# Patient Record
Sex: Male | Born: 1995 | Hispanic: Yes | Marital: Single | Smoking: Current some day smoker
Health system: Southern US, Community
[De-identification: ages and names within clinical notes are randomized; demographics above are authoritative.]

## PROBLEM LIST (undated history)

## (undated) DIAGNOSIS — J45909 Unspecified asthma, uncomplicated: Secondary | ICD-10-CM

---
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
## 2013-03-05 ENCOUNTER — Emergency Department (HOSPITAL_COMMUNITY)
Admission: EM | Admit: 2013-03-05 | Discharge: 2013-03-06 | Disposition: A | Discharge status: Home / Self Care | Payer: Medicaid Other | Attending: Emergency Medicine | Admitting: Emergency Medicine

## 2013-03-05 ENCOUNTER — Encounter (HOSPITAL_COMMUNITY): Payer: Self-pay | Admitting: *Deleted

## 2013-03-05 DIAGNOSIS — S0502XA Injury of conjunctiva and corneal abrasion without foreign body, left eye, initial encounter: Secondary | ICD-10-CM

## 2013-03-05 DIAGNOSIS — Y929 Unspecified place or not applicable: Secondary | ICD-10-CM | POA: Insufficient documentation

## 2013-03-05 DIAGNOSIS — S058X9A Other injuries of unspecified eye and orbit, initial encounter: Secondary | ICD-10-CM | POA: Insufficient documentation

## 2013-03-05 DIAGNOSIS — J45909 Unspecified asthma, uncomplicated: Secondary | ICD-10-CM | POA: Insufficient documentation

## 2013-03-05 DIAGNOSIS — X58XXXA Exposure to other specified factors, initial encounter: Secondary | ICD-10-CM | POA: Insufficient documentation

## 2013-03-05 DIAGNOSIS — Z79899 Other long term (current) drug therapy: Secondary | ICD-10-CM | POA: Insufficient documentation

## 2013-03-05 DIAGNOSIS — Y939 Activity, unspecified: Secondary | ICD-10-CM | POA: Insufficient documentation

## 2013-03-05 HISTORY — DX: Unspecified asthma, uncomplicated: J45.909

## 2013-03-05 MED ORDER — FLUORESCEIN SODIUM 1 MG OP STRP
1.0000 | ORAL_STRIP | Freq: Once | OPHTHALMIC | Status: AC
  Administered 2013-03-06: 1 via OPHTHALMIC
  Filled 2013-03-05: qty 1

## 2013-03-05 MED ORDER — TETRACAINE HCL 0.5 % OP SOLN
1.0000 [drp] | Freq: Once | OPHTHALMIC | Status: AC
  Administered 2013-03-06: 2 [drp] via OPHTHALMIC
  Filled 2013-03-05: qty 2

## 2013-03-05 NOTE — ED Notes (Signed)
Pt states he woke up this morning with left eye pain. Pt states that his left eye is draining. Pt used OTC drops which made his eye burn.

## 2013-03-05 NOTE — ED Provider Notes (Signed)
CSN: [id number]     Arrival date & time 03/05/13  2054 History     First MD Initiated Contact with Patient 03/05/13 2303     Chief Complaint  Patient presents with  . Eye Pain   (Consider location/radiation/quality/duration/timing/severity/associated sxs/prior Treatment) HPI Comments: Patient has a 17 year old man presenting to the emergency department for left eye pain that began this morning when the patient woke up. She states it feels as though something is at his side and scratching it. He has also had associated watery discharge with this feeling. Patient rates his pain 8/10 at worst. No alleviating factors. Patient is not a contact lens wearer. Patient has not Agricultural engineer. He denies any fevers or chills or visual disturbances.   Past Medical History  Diagnosis Date  . Asthma    History reviewed. No pertinent past surgical history. History reviewed. No pertinent family history. History  Substance Use Topics  . Smoking status: Never Smoker   . Smokeless tobacco: Not on file  . Alcohol Use: No    Review of Systems  Constitutional: Negative for fever and chills.  HENT: Negative.   Eyes: Positive for pain, discharge and redness. Negative for photophobia and visual disturbance.  Neurological: Negative for headaches.    Allergies  Review of patient's allergies indicates no known allergies.  Home Medications   Current Outpatient Rx  Name  Route  Sig  Dispense  Refill  . Multiple Vitamin (MULTI-VITAMIN PO)   Oral   Take 1 tablet by mouth daily.         . Omega-3 Fatty Acids (FISH OIL PO)   Oral   Take 1 capsule by mouth daily.         Marland Kitchen erythromycin ophthalmic ointment   Ophthalmic   Apply to eye every 6 (six) hours. Place 1/2 inch ribbon of ointment in the affected eye 4 times a day for five days   1 g   1    BP 122/78  Pulse 69  Temp(Src) 97.9 F (36.6 C) (Oral)  Resp 16  SpO2 99% Physical Exam  Constitutional: He is oriented to person, place, and  time. He appears well-developed and well-nourished. No distress.  HENT:  Head: Normocephalic and atraumatic.  Right Ear: External ear normal.  Left Ear: External ear normal.  Nose: Nose normal.  Eyes: EOM are normal. Pupils are equal, round, and reactive to light. Right eye exhibits no chemosis, no discharge, no exudate and no hordeolum. No foreign body present in the right eye. Left eye exhibits discharge (watery). Left eye exhibits no chemosis, no exudate and no hordeolum. Left conjunctiva is injected (Minimally).  Fundoscopic exam:      The right eye shows no hemorrhage and no papilledema.       The left eye shows no hemorrhage and no papilledema.  Slit lamp exam:      The left eye shows corneal abrasion and fluorescein uptake. The left eye shows no corneal flare, no corneal ulcer, no foreign body and no hyphema.  OD: 20/13 OS: 20/13 Bilateral: 20/13  Neck: Neck supple.  Pulmonary/Chest: Effort normal.  Neurological: He is alert and oriented to person, place, and time.  Skin: Skin is warm and dry. He is not diaphoretic.  Psychiatric: He has a normal mood and affect.    ED Course   Procedures (including critical care time)  Labs Reviewed - No data to display No results found. 1. Corneal abrasion, left, initial encounter     MDM  Afebrile,  NAD, non-toxic appearing, AAOx4. Pt with corneal abrasion on PE. Eye irrigated w NS, no evidence of FB.  No change in vision, acuity equal bilaterally.  Pt is not a contact lens wearer.  Exam non-concerning for orbital cellulitis, hyphema, corneal ulcers. Patient will be discharged home with erythromycin.   Patient understands to follow up with ophthalmology, & to return to ER if new symptoms develop including change in vision, purulent drainage, or entrapment. Return precautions discussed. Patient is stable at time of discharge      Jeannetta Ellis, PA-C 03/06/13 1610

## 2013-03-06 MED ORDER — ERYTHROMYCIN 5 MG/GM OP OINT: TOPICAL_OINTMENT | Freq: Four times a day (QID) | OPHTHALMIC | Status: DC

## 2013-03-06 NOTE — Discharge Instructions (Signed)
Please followup with the eye doctor listed above. Please use antibiotic drops as prescribed. Please take Motrin or Tylenol for any discomfort you may have. Please read all discharge instructions and return precautions.     Corneal Abrasion The cornea is the clear covering at the front and center of the eye. When looking at the colored portion (iris) of the eye, you are looking through that person's cornea.  This very thin tissue is made up of many layers. The surface layer is a single layer of cells called the corneal epithelium. This is one of the most sensitive tissues in the body. If a scratch or injury causes the corneal epithelium to come off, it is called a corneal abrasion. If the injury extends to the tissues below the epithelium, the condition is called a corneal ulcer.  CAUSES   Scratches.  Trauma.  Foreign body in the eye.  Some people have recurrences of abrasions in the area of the original injury even after they heal. This is called recurrent erosion syndrome. Recurrent erosion syndromes generally improve and go away with time. SYMPTOMS   Eye pain.  Difficulty or inability to keep the injured eye open.  The eye becomes very sensitive to light.  Recurrent erosions tend to happen suddenly, first thing in the morning  usually upon awakening and opening the eyes. DIAGNOSIS  Your eye professional can diagnose a corneal abrasion during an eye exam. Dye is usually placed in the eye using a drop or a small paper strip moistened by the patient's tears. When the eye is examined with a special light, the abrasion shows up clearly because of the dye. TREATMENT   Small abrasions may be treated with antibiotic drops or ointment alone.  Usually a pressure patch is specially applied. Pressure patches prevent the eye from blinking, allowing the corneal epithelium to heal. Because blinking is less, a pressure patch also reduces the amount of pain present in the eye during healing. Most  corneal abrasions heal within 2-3 days with no effect on vision. WARNING: Do not drive or operate machinery while your eye is patched. Your ability to judge distances is impaired.  If abrasion becomes infected and spreads to the deeper tissues of the cornea, a corneal ulcer can result. This is serious because it can cause corneal scarring. Corneal scars interfere with light passing through the cornea, and cause a loss of vision in the involved eye.  If your caregiver has given you a follow-up appointment, it is very important to keep that appointment. Not keeping the appointment could result in a severe eye infection or permanent loss of vision. If there is any problem keeping the appointment, you must call back to this facility for assistance. SEEK MEDICAL CARE IF:   You have pain, light sensitivity and a scratchy feeling in one eye (or both).  Your pressure patch keeps loosening up and you can blink your eye under the patch after treatment.  Any kind of discharge develops from the involved eye after treatment or if the lids stick together in the morning.  You have the same symptoms in the morning as you did with the original abrasion days, weeks or months after the abrasion healed. MAKE SURE YOU:   Understand these instructions.  Will watch your condition.  Will get help right away if you are not doing well or get worse. Document Released: 06/26/2000 Document Revised: 09/21/2011 Document Reviewed: 02/02/2008 [facility] Patient Information 2014 [place], [place].

## 2013-03-06 NOTE — ED Provider Notes (Signed)
Medical screening examination/treatment/procedure(s) were performed by non-physician practitioner and as supervising physician I was immediately available for consultation/collaboration.  Doug Sou, MD 03/06/13 [phone]

## 2014-06-28 ENCOUNTER — Encounter: Payer: Self-pay | Admitting: Pediatrics

## 2014-07-15 ENCOUNTER — Encounter (HOSPITAL_COMMUNITY): Payer: Self-pay | Admitting: Emergency Medicine

## 2014-07-15 ENCOUNTER — Emergency Department (HOSPITAL_COMMUNITY)
Admission: EM | Admit: 2014-07-15 | Discharge: 2014-07-15 | Disposition: A | Discharge status: Home / Self Care | Payer: Medicaid Other | Attending: Emergency Medicine | Admitting: Emergency Medicine

## 2014-07-15 DIAGNOSIS — Z79899 Other long term (current) drug therapy: Secondary | ICD-10-CM | POA: Insufficient documentation

## 2014-07-15 DIAGNOSIS — R110 Nausea: Secondary | ICD-10-CM | POA: Insufficient documentation

## 2014-07-15 DIAGNOSIS — J45909 Unspecified asthma, uncomplicated: Secondary | ICD-10-CM | POA: Insufficient documentation

## 2014-07-15 DIAGNOSIS — M545 Low back pain, unspecified: Secondary | ICD-10-CM

## 2014-07-15 DIAGNOSIS — N200 Calculus of kidney: Secondary | ICD-10-CM | POA: Diagnosis not present

## 2014-07-15 DIAGNOSIS — Z720 Tobacco use: Secondary | ICD-10-CM | POA: Insufficient documentation

## 2014-07-15 LAB — URINALYSIS, ROUTINE W REFLEX MICROSCOPIC
BILIRUBIN URINE: NEGATIVE
GLUCOSE, UA: NEGATIVE mg/dL
Ketones, ur: NEGATIVE mg/dL
Leukocytes, UA: NEGATIVE
Nitrite: NEGATIVE
PROTEIN: NEGATIVE mg/dL
SPECIFIC GRAVITY, URINE: 1.022 (ref 1.005–1.030)
UROBILINOGEN UA: 0.2 mg/dL (ref 0.0–1.0)
pH: 6 (ref 5.0–8.0)

## 2014-07-15 LAB — URINE MICROSCOPIC-ADD ON

## 2014-07-15 MED ORDER — CYCLOBENZAPRINE HCL 10 MG PO TABS
10.0000 mg | ORAL_TABLET | Freq: Once | ORAL | Status: AC
  Administered 2014-07-15: 10 mg via ORAL
  Filled 2014-07-15: qty 1

## 2014-07-15 MED ORDER — HYDROCODONE-ACETAMINOPHEN 5-325 MG PO TABS: 1.0000 | ORAL_TABLET | Freq: Four times a day (QID) | ORAL | Status: DC | PRN

## 2014-07-15 MED ORDER — HYDROCODONE-ACETAMINOPHEN 5-325 MG PO TABS
2.0000 | ORAL_TABLET | Freq: Once | ORAL | Status: AC
  Administered 2014-07-15: 2 via ORAL
  Filled 2014-07-15: qty 2

## 2014-07-15 NOTE — Discharge Instructions (Signed)

## 2014-07-15 NOTE — ED Provider Notes (Signed)
CSN: [id number]     Arrival date & time 07/15/14  0935 History   First MD Initiated Contact with Patient 07/15/14 [phone]     Chief Complaint  Patient presents with  . Back Pain     (Consider location/radiation/quality/duration/timing/severity/associated sxs/prior Treatment) Patient is a 19 y.o. male presenting with back pain. The history is provided by the patient.  Back Pain Location:  Lumbar spine Quality:  Stabbing Radiates to: occasionally radiates forward. Pain severity:  Mild Onset quality:  Gradual Duration:  3 days Timing:  Intermittent Progression:  Unchanged Chronicity:  New Context: not physical stress and not recent injury   Relieved by:  Nothing Worsened by:  Coughing Associated symptoms: no abdominal pain, no chest pain and no fever     Past Medical History  Diagnosis Date  . Asthma    History reviewed. No pertinent past surgical history. History reviewed. No pertinent family history. History  Substance Use Topics  . Smoking status: Current Every Day Smoker  . Smokeless tobacco: Not on file  . Alcohol Use: Yes    Review of Systems  Constitutional: Negative for fever.  Cardiovascular: Negative for chest pain and leg swelling.  Gastrointestinal: Positive for nausea. Negative for vomiting and abdominal pain.  Musculoskeletal: Positive for back pain.  All other systems reviewed and are negative.     Allergies  Review of patient's allergies indicates no known allergies.  Home Medications   Prior to Admission medications   Medication Sig Start Date End Date Taking? Authorizing Provider  erythromycin ophthalmic ointment Apply to eye every 6 (six) hours. Place 1/2 inch ribbon of ointment in the affected eye 4 times a day for five days 03/06/13   Lise Auer Piepenbrink, PA-C  Multiple Vitamin (MULTI-VITAMIN PO) Take 1 tablet by mouth daily.    Historical Provider, MD  Omega-3 Fatty Acids (FISH OIL PO) Take 1 capsule by mouth daily.    Historical Provider,  MD   BP 139/80 mmHg  Pulse 78  Temp(Src) 97.5 F (36.4 C) (Oral)  Resp 20  SpO2 98% Physical Exam  Constitutional: He is oriented to person, place, and time. He appears well-developed and well-nourished. No distress.  HENT:  Head: Normocephalic and atraumatic.  Mouth/Throat: No oropharyngeal exudate.  Eyes: EOM are normal. Pupils are equal, round, and reactive to light.  Neck: Normal range of motion. Neck supple.  Cardiovascular: Normal rate and regular rhythm.  Exam reveals no friction rub.   No murmur heard. Pulmonary/Chest: Effort normal and breath sounds normal. No respiratory distress. He has no wheezes. He has no rales.  Abdominal: Soft. He exhibits no distension. There is no tenderness. There is no rebound.  Musculoskeletal: Normal range of motion. He exhibits no edema.       Thoracic back: He exhibits normal range of motion, no tenderness, no bony tenderness, no spasm and normal pulse.  Neurological: He is alert and oriented to person, place, and time.  Skin: Skin is warm. No rash noted. He is not diaphoretic.  Nursing note and vitals reviewed.   ED Course  Procedures (including critical care time) Labs Review Labs Reviewed  URINALYSIS, ROUTINE W REFLEX MICROSCOPIC    Imaging Review No results found.   EKG Interpretation None      MDM   Final diagnoses:  Right-sided low back pain without sciatica  Kidney stone    19 year old male with 3 days of right back pain. Worse with coughing. Pain is intermittent, no alleviating factors or instigating factors. No injury  that he notes. Was nauseated earlier this morning but did not vomit. No dysuria, hematuria, abdominal pain. Pain will occasionally radiate down and forward but not consistently. Here vitals stable. No CVA tenderness. No palpable tenderness or muscle spasm appreciated. Will check urine to look for blood. If musculoskeletal or related to kidney stones will treat with pain meds, so we'll give pain meds now.  He is young and healthy, no other medical problems, no hx of kidney stones. Patient's urine shows small blood, likely kidney stone. Given pain meds and instructed to f/u with PCP.  Elwin Mocha, MD 07/15/14 [phone]

## 2014-07-15 NOTE — ED Notes (Signed)
Pt c/o right sided back pain x 3 days; pt denies obvious inury

## 2014-08-05 ENCOUNTER — Emergency Department (HOSPITAL_COMMUNITY): Payer: Medicaid Other

## 2014-08-05 ENCOUNTER — Encounter (HOSPITAL_COMMUNITY): Payer: Self-pay | Admitting: *Deleted

## 2014-08-05 ENCOUNTER — Emergency Department (HOSPITAL_COMMUNITY)
Admission: EM | Admit: 2014-08-05 | Discharge: 2014-08-05 | Disposition: A | Discharge status: Home / Self Care | Payer: Medicaid Other | Attending: Emergency Medicine | Admitting: Emergency Medicine

## 2014-08-05 DIAGNOSIS — J45909 Unspecified asthma, uncomplicated: Secondary | ICD-10-CM | POA: Diagnosis not present

## 2014-08-05 DIAGNOSIS — Z79899 Other long term (current) drug therapy: Secondary | ICD-10-CM | POA: Diagnosis not present

## 2014-08-05 DIAGNOSIS — R319 Hematuria, unspecified: Secondary | ICD-10-CM | POA: Diagnosis not present

## 2014-08-05 DIAGNOSIS — R109 Unspecified abdominal pain: Secondary | ICD-10-CM

## 2014-08-05 DIAGNOSIS — Z720 Tobacco use: Secondary | ICD-10-CM | POA: Insufficient documentation

## 2014-08-05 LAB — URINALYSIS, ROUTINE W REFLEX MICROSCOPIC
BILIRUBIN URINE: NEGATIVE
GLUCOSE, UA: NEGATIVE mg/dL
Ketones, ur: NEGATIVE mg/dL
Leukocytes, UA: NEGATIVE
Nitrite: NEGATIVE
PH: 6.5 (ref 5.0–8.0)
Protein, ur: NEGATIVE mg/dL
SPECIFIC GRAVITY, URINE: 1.022 (ref 1.005–1.030)
UROBILINOGEN UA: 2 mg/dL — AB (ref 0.0–1.0)

## 2014-08-05 LAB — URINE MICROSCOPIC-ADD ON

## 2014-08-05 LAB — CBC WITH DIFFERENTIAL/PLATELET
BASOS PCT: 1 % (ref 0–1)
Basophils Absolute: 0 10*3/uL (ref 0.0–0.1)
Eosinophils Absolute: 0.4 10*3/uL (ref 0.0–0.7)
Eosinophils Relative: 8 % — ABNORMAL HIGH (ref 0–5)
HEMATOCRIT: 38.7 % — AB (ref 39.0–52.0)
HEMOGLOBIN: 12.5 g/dL — AB (ref 13.0–17.0)
LYMPHS ABS: 1.9 10*3/uL (ref 0.7–4.0)
Lymphocytes Relative: 37 % (ref 12–46)
MCH: 21.2 pg — ABNORMAL LOW (ref 26.0–34.0)
MCHC: 32.3 g/dL (ref 30.0–36.0)
MCV: 65.6 fL — AB (ref 78.0–100.0)
Monocytes Absolute: 0.8 10*3/uL (ref 0.1–1.0)
Monocytes Relative: 16 % — ABNORMAL HIGH (ref 3–12)
NEUTROS ABS: 2 10*3/uL (ref 1.7–7.7)
Neutrophils Relative %: 39 % — ABNORMAL LOW (ref 43–77)
Platelets: 272 10*3/uL (ref 150–400)
RBC: 5.9 MIL/uL — AB (ref 4.22–5.81)
RDW: 14.5 % (ref 11.5–15.5)
WBC: 5.1 10*3/uL (ref 4.0–10.5)

## 2014-08-05 LAB — BASIC METABOLIC PANEL
Anion gap: 11 (ref 5–15)
BUN: 8 mg/dL (ref 6–23)
CO2: 24 mmol/L (ref 19–32)
CREATININE: 0.78 mg/dL (ref 0.50–1.35)
Calcium: 8.9 mg/dL (ref 8.4–10.5)
Chloride: 102 mmol/L (ref 96–112)
GFR calc Af Amer: >90 mL/min (ref >90)
Glucose, Bld: 97 mg/dL (ref 70–99)
Potassium: 3.8 mmol/L (ref 3.5–5.1)
Sodium: 137 mmol/L (ref 135–145)

## 2014-08-05 MED ORDER — IBUPROFEN 400 MG PO TABS
600.0000 mg | ORAL_TABLET | Freq: Once | ORAL | Status: AC
  Administered 2014-08-05: 600 mg via ORAL
  Filled 2014-08-05 (×2): qty 1

## 2014-08-05 MED ORDER — TAMSULOSIN HCL 0.4 MG PO CAPS: 0.4000 mg | ORAL_CAPSULE | Freq: Every day | ORAL | Status: DC

## 2014-08-05 MED ORDER — HYDROCODONE-ACETAMINOPHEN 5-325 MG PO TABS: 1.0000 | ORAL_TABLET | Freq: Four times a day (QID) | ORAL | Status: DC | PRN

## 2014-08-05 MED ORDER — ONDANSETRON 4 MG PO TBDP: ORAL_TABLET | ORAL | Status: DC

## 2014-08-05 NOTE — ED Provider Notes (Signed)
CSN: [id number]     Arrival date & time 08/05/14  1542 History   First MD Initiated Contact with Patient 08/05/14 1829     Chief Complaint  Patient presents with  . Flank Pain     (Consider location/radiation/quality/duration/timing/severity/associated sxs/prior Treatment) Patient is a 19 y.o. male presenting with flank pain.  Flank Pain This is a recurrent problem. The current episode started yesterday. The problem occurs constantly. The problem has been resolved. Pertinent negatives include no chest pain, no abdominal pain, no headaches and no shortness of breath. Nothing aggravates the symptoms. Nothing relieves the symptoms.    Past Medical History  Diagnosis Date  . Asthma    History reviewed. No pertinent past surgical history. History reviewed. No pertinent family history. History  Substance Use Topics  . Smoking status: Current Some Day Smoker  . Smokeless tobacco: Not on file  . Alcohol Use: No    Review of Systems  Respiratory: Negative for shortness of breath.   Cardiovascular: Negative for chest pain.  Gastrointestinal: Negative for abdominal pain.  Genitourinary: Positive for flank pain.  Neurological: Negative for headaches.  All other systems reviewed and are negative.     Allergies  Review of patient's allergies indicates no known allergies.  Home Medications   Prior to Admission medications   Medication Sig Start Date End Date Taking? Authorizing Provider  erythromycin ophthalmic ointment Apply to eye every 6 (six) hours. Place 1/2 inch ribbon of ointment in the affected eye 4 times a day for five days Patient not taking: Reported on 07/15/2014 03/06/13   Lise AuerJennifer L Piepenbrink, PA-C  HYDROcodone-acetaminophen (NORCO/VICODIN) 5-325 MG per tablet Take 1 tablet by mouth every 6 (six) hours as needed for moderate pain. 08/05/14   Mirian MoMatthew Ricca Melgarejo, MD  Multiple Vitamin (MULTI-VITAMIN PO) Take 1 tablet by mouth daily.    Historical Provider, MD  Omega-3 Fatty  Acids (FISH OIL PO) Take 1 capsule by mouth daily.    Historical Provider, MD  ondansetron (ZOFRAN ODT) 4 MG disintegrating tablet 4mg  ODT q4 hours prn nausea/vomit 08/05/14   Mirian MoMatthew Lizzette Carbonell, MD  tamsulosin (FLOMAX) 0.4 MG CAPS capsule Take 1 capsule (0.4 mg total) by mouth daily. 08/05/14   Mirian MoMatthew Wiktoria Hemrick, MD   BP 132/55 mmHg  Pulse 70  Temp(Src) 97.9 F (36.6 C) (Oral)  Resp 17  Ht 5\' 6"  (1.676 m)  Wt 216 lb (97.977 kg)  BMI 34.88 kg/m2  SpO2 100% Physical Exam  Constitutional: He is oriented to person, place, and time. He appears well-developed and well-nourished.  HENT:  Head: Normocephalic and atraumatic.  Eyes: Conjunctivae and EOM are normal.  Neck: Normal range of motion. Neck supple.  Cardiovascular: Normal rate, regular rhythm and normal heart sounds.   Pulmonary/Chest: Effort normal and breath sounds normal. No respiratory distress.  Abdominal: He exhibits no distension. There is no tenderness. There is no rebound and no guarding.  Musculoskeletal: Normal range of motion.  Neurological: He is alert and oriented to person, place, and time.  Skin: Skin is warm and dry.  Vitals reviewed.   ED Course  Procedures (including critical care time) Labs Review Labs Reviewed  URINALYSIS, ROUTINE W REFLEX MICROSCOPIC - Abnormal; Notable for the following:    Hgb urine dipstick MODERATE (*)    Urobilinogen, UA 2.0 (*)    All other components within normal limits  CBC WITH DIFFERENTIAL/PLATELET - Abnormal; Notable for the following:    RBC 5.90 (*)    Hemoglobin 12.5 (*)    HCT  38.7 (*)    MCV 65.6 (*)    MCH 21.2 (*)    Neutrophils Relative % 39 (*)    Monocytes Relative 16 (*)    Eosinophils Relative 8 (*)    All other components within normal limits  URINE MICROSCOPIC-ADD ON  BASIC METABOLIC PANEL    Imaging Review Ct Renal Stone Study  08/05/2014   CLINICAL DATA:  Chronic left flank pain for 1 month. Mild dysuria and hematuria. Initial encounter.  EXAM: CT ABDOMEN  AND PELVIS WITHOUT CONTRAST  TECHNIQUE: Multidetector CT imaging of the abdomen and pelvis was performed following the standard protocol without IV contrast.  COMPARISON:  None.  FINDINGS: The visualized lung bases are clear.  The liver and spleen are unremarkable in appearance. The gallbladder is within normal limits. The pancreas and adrenal glands are unremarkable.  There is incomplete rotation of both kidneys. The kidneys are otherwise unremarkable. There is no evidence of hydronephrosis. No renal or ureteral stones are seen. No perinephric stranding is appreciated.  No free fluid is identified. The small bowel is unremarkable in appearance. The stomach is within normal limits. No acute vascular abnormalities are seen.  The appendix is normal in caliber and contains air, without evidence for appendicitis. The colon is unremarkable in appearance.  The bladder is mildly distended and grossly unremarkable. The prostate remains normal in size. No inguinal lymphadenopathy is seen.  No acute osseous abnormalities are identified.  IMPRESSION: No acute abnormality seen within the abdomen or pelvis. No evidence of hydronephrosis. No renal or ureteral stone seen.   Electronically Signed   By: Roanna Raider M.D.   On: 08/05/2014 21:52     EKG Interpretation None     Emergency Focused Ultrasound Exam Limited retroperitoneal ultrasound of kidneys  Performed and interpreted by Dr. Littie Deeds Indication: L flank pain Focused abdominal ultrasound with both kidneys imaged in transverse and longitudinal planes in real-time. Interpretation: No hydronephrosis visualized bilaterally.  Bladder full.   Images archived electronically  MDM   Final diagnoses:  Abdominal pain, acute  Hematuria    19 y.o. male with pertinent PMH of recent visit for R flank pain with presumed R nephrolithiasis after hematuria present presents with recurrent flank pain, this side L sided.  He has had nausea and chills but no fever. He has  no other medical history. His history is consistent with nephrolithiasis at this time, however as he has had no renal function measurement and has not had medical care in some time, we'll check basic labs. Ultrasound obtained by myself as above without hydronephrosis.  Checked basic labwork which demonstrated anemia.  As pt did not have any prior imaging, obtained CT scan which did not demonstrate stone.   Discussed results with pt and encouraged close fu with urology and PC, which he agreed to.  DC home in stable condition.  I have reviewed all laboratory and imaging studies if ordered as above  1. Hematuria   2. Abdominal pain, acute         Mirian Mo, MD 08/06/14 [phone]

## 2014-08-05 NOTE — Discharge Instructions (Signed)
Dietary Guidelines to Help Prevent Kidney Stones °Your risk of kidney stones can be decreased by adjusting the foods you eat. The most important thing you can do is drink enough fluid. You should drink enough fluid to keep your urine clear or pale yellow. The following guidelines provide specific information for the type of kidney stone you have had. °GUIDELINES ACCORDING TO TYPE OF KIDNEY STONE °Calcium Oxalate Kidney Stones °· Reduce the amount of salt you eat. Foods that have a lot of salt cause your body to release excess calcium into your urine. The excess calcium can combine with a substance called oxalate to form kidney stones. °· Reduce the amount of animal protein you eat if the amount you eat is excessive. Animal protein causes your body to release excess calcium into your urine. Ask your dietitian how much protein from animal sources you should be eating. °· Avoid foods that are high in oxalates. If you take vitamins, they should have less than 500 mg of vitamin C. Your body turns vitamin C into oxalates. You do not need to avoid fruits and vegetables high in vitamin C. °Calcium Phosphate Kidney Stones °· Reduce the amount of salt you eat to help prevent the release of excess calcium into your urine. °· Reduce the amount of animal protein you eat if the amount you eat is excessive. Animal protein causes your body to release excess calcium into your urine. Ask your dietitian how much protein from animal sources you should be eating. °· Get enough calcium from food or take a calcium supplement (ask your dietitian for recommendations). Food sources of calcium that do not increase your risk of kidney stones include: °· Broccoli. °· Dairy products, such as cheese and yogurt. °· Pudding. °Uric Acid Kidney Stones °· Do not have more than 6 oz of animal protein per day. °FOOD SOURCES °Animal Protein Sources °· Meat (all types). °· Poultry. °· Eggs. °· Fish, seafood. °Foods High in Salt °· Salt seasonings. °· Soy  sauce. °· Teriyaki sauce. °· Cured and processed meats. °· Salted crackers and snack foods. °· Fast food. °· Canned soups and most canned foods. °Foods High in Oxalates °· Grains: °· Amaranth. °· Barley. °· Grits. °· Wheat germ. °· Bran. °· Buckwheat flour. °· All bran cereals. °· Pretzels. °· Whole wheat bread. °· Vegetables: °· Beans (wax). °· Beets and beet greens. °· Collard greens. °· Eggplant. °· Escarole. °· Leeks. °· Okra. °· Parsley. °· Rutabagas. °· Spinach. °· Swiss chard. °· Tomato paste. °· Fried potatoes. °· Sweet potatoes. °· Fruits: °· Red currants. °· Figs. °· Kiwi. °· Rhubarb. °· Meat and Other Protein Sources: °· Beans (dried). °· Soy burgers and other soybean products. °· Miso. °· Nuts (peanuts, almonds, pecans, cashews, hazelnuts). °· Nut butters. °· Sesame seeds and tahini (paste made of sesame seeds). °· Poppy seeds. °· Beverages: °· Chocolate drink mixes. °· Soy milk. °· Instant iced tea. °· Juices made from high-oxalate fruits or vegetables. °· Other: °· Carob. °· Chocolate. °· Fruitcake. °· Marmalades. °Document Released: 10/24/2010 Document Revised: 07/04/2013 Document Reviewed: 05/26/2013 °ExitCare® Patient Information ©2015 [employer]. This information is not intended to replace advice given to you by your health care provider. Make sure you discuss any questions you have with your health care provider. ° °Kidney Stones °Kidney stones (urolithiasis) are deposits that form inside your kidneys. The intense pain is caused by the stone moving through the urinary tract. When the stone moves, the ureter goes into spasm around the   10/24/2010 Document Revised: 07/04/2013 Document Reviewed: 05/26/2013  ExitCare Patient Information 2015 [employer]. This information is not intended to replace advice given to you by your health care provider. Make sure you discuss any questions you have with your health care provider.  Kidney Stones  Kidney stones (urolithiasis) are deposits that form inside your kidneys. The intense pain is caused by the stone moving through the urinary tract. When the stone moves, the ureter goes into spasm around the stone. The stone is usually passed in the urine.   CAUSES    A disorder that makes certain neck glands produce too much parathyroid hormone (primary hyperparathyroidism).   A buildup of uric acid crystals, similar to gout in your joints.   Narrowing (stricture) of the ureter.   A kidney obstruction present at birth (congenital obstruction).   Previous surgery on  the kidney or ureters.   Numerous kidney infections.  SYMPTOMS    Feeling sick to your stomach (nauseous).   Throwing up (vomiting).   Blood in the urine (hematuria).   Pain that usually spreads (radiates) to the groin.   Frequency or urgency of urination.  DIAGNOSIS    Taking a history and physical exam.   Blood or urine tests.   CT scan.   Occasionally, an examination of the inside of the urinary bladder (cystoscopy) is performed.  TREATMENT    Observation.   Increasing your fluid intake.   Extracorporeal shock wave lithotripsy--This is a noninvasive procedure that uses shock waves to break up kidney stones.   Surgery may be needed if you have severe pain or persistent obstruction. There are various surgical procedures. Most of the procedures are performed with the use of small instruments. Only small incisions are needed to accommodate these instruments, so recovery time is minimized.  The size, location, and chemical composition are all important variables that will determine the proper choice of action for you. Talk to your health care provider to better understand your situation so that you will minimize the risk of injury to yourself and your kidney.   HOME CARE INSTRUCTIONS    Drink enough water and fluids to keep your urine clear or pale yellow. This will help you to pass the stone or stone fragments.   Strain all urine through the provided strainer. Keep all particulate matter and stones for your health care provider to see. The stone causing the pain may be as small as a grain of salt. It is very important to use the strainer each and every time you pass your urine. The collection of your stone will allow your health care provider to analyze it and verify that a stone has actually passed. The stone analysis will often identify what you can do to reduce the incidence of recurrences.   Only take over-the-counter or prescription medicines for pain, discomfort, or fever as directed by your  health care provider.   Make a follow-up appointment with your health care provider as directed.   Get follow-up X-rays if required. The absence of pain does not always mean that the stone has passed. It may have only stopped moving. If the urine remains completely obstructed, it can cause loss of kidney function or even complete destruction of the kidney. It is your responsibility to make sure X-rays and follow-ups are completed. Ultrasounds of the kidney can show blockages and the status of the kidney. Ultrasounds are not associated with any radiation and can be performed easily in a matter of minutes.  SEEK   Released: 06/29/2005 Document Revised: 03/01/2013 Document Reviewed: 11/30/2012 [facility] CExitCare Patient Information 2015 [place], [place]. This information is not intended to replace advice given to you by your health care provider. Make sure you discuss any questions you have with your health care provider. Anemia, Nonspecific Anemia is a condition in which the concentration of red blood cells or hemoglobin in the blood is below normal. Hemoglobin is a substance in red blood cells that carries oxygen to the tissues of the body. Anemia results in not enough oxygen reaching these tissues.  CAUSES  Common causes of anemia include:   Excessive bleeding. Bleeding may be internal or external. This  includes excessive bleeding from periods (in women) or from the intestine.   Poor nutrition.   Chronic kidney, thyroid, and liver disease.  Bone marrow disorders that decrease red blood cell production.  Cancer and treatments for cancer.  HIV, AIDS, and their treatments.  Spleen problems that increase red blood cell destruction.  Blood disorders.  Excess destruction of red blood cells due to infection, medicines, and autoimmune disorders. SIGNS AND SYMPTOMS   Minor weakness.   Dizziness.   Headache.  Palpitations.   Shortness of breath, especially with exercise.   Paleness.  Cold sensitivity.  Indigestion.  Nausea.  Difficulty sleeping.  Difficulty concentrating. Symptoms may occur suddenly or they may develop slowly.  DIAGNOSIS  Additional blood tests are often needed. These help your health care provider determine the best treatment. Your health care provider will check your stool for blood and look for other causes of blood loss.  TREATMENT  Treatment varies depending on the cause of the anemia. Treatment can include:   Supplements of iron, vitamin B12, or folic acid.   Hormone medicines.   A blood transfusion. This may be needed if blood loss is severe.   Hospitalization. This may be needed if there is significant continual blood loss.   Dietary changes.  Spleen removal. HOME CARE INSTRUCTIONS Keep all follow-up appointments. It often takes many weeks to correct anemia, and having your health care provider check on your condition and your response to treatment is very important. SEEK IMMEDIATE MEDICAL CARE IF:   You develop extreme weakness, shortness of breath, or chest pain.   You become dizzy or have trouble concentrating.  You develop heavy vaginal bleeding.   You develop a rash.   You have bloody or black, tarry stools.   You faint.   You vomit up blood.   You vomit repeatedly.   You have abdominal pain.  You  have a fever or persistent symptoms for more than 2-3 days.   You have a fever and your symptoms suddenly get worse.   You are dehydrated.  MAKE SURE YOU:  Understand these instructions.  Will watch your condition.  Will get help right away if you are not doing well or get worse. Document Released: 08/06/2004 Document Revised: 03/01/2013 Document Reviewed: 12/23/2012 [facility]Exit[facility] Patient Information 2015 [place], [place]. This information is not intended to replace advice given to you by your health care provider. Make sure you discuss any questions you have with your health care provider.

## 2014-08-05 NOTE — ED Notes (Signed)
Patient transported to CT 

## 2014-08-05 NOTE — ED Notes (Signed)
Pt c/o left flank pain for a month. Pt states he was dx with kidney stones. Tried to make an appointment with PCP but will not be seen till March. Pt reports some mild dysuria. Reports hematuria yesterday.

## 2014-08-05 NOTE — ED Notes (Signed)
Pt a/o x 4 on d/c with steady gait. Pt refused wheelchair. 

## 2015-01-07 ENCOUNTER — Encounter (HOSPITAL_COMMUNITY): Payer: Self-pay | Admitting: Emergency Medicine

## 2015-01-07 DIAGNOSIS — Z720 Tobacco use: Secondary | ICD-10-CM | POA: Insufficient documentation

## 2015-01-07 DIAGNOSIS — R197 Diarrhea, unspecified: Secondary | ICD-10-CM | POA: Diagnosis present

## 2015-01-07 DIAGNOSIS — K2970 Gastritis, unspecified, without bleeding: Secondary | ICD-10-CM | POA: Diagnosis not present

## 2015-01-07 DIAGNOSIS — J45909 Unspecified asthma, uncomplicated: Secondary | ICD-10-CM | POA: Insufficient documentation

## 2015-01-07 LAB — URINE MICROSCOPIC-ADD ON

## 2015-01-07 LAB — URINALYSIS, ROUTINE W REFLEX MICROSCOPIC
BILIRUBIN URINE: NEGATIVE
GLUCOSE, UA: NEGATIVE mg/dL
Ketones, ur: 15 mg/dL — AB
Leukocytes, UA: NEGATIVE
Nitrite: NEGATIVE
PROTEIN: NEGATIVE mg/dL
SPECIFIC GRAVITY, URINE: 1.027 (ref 1.005–1.030)
UROBILINOGEN UA: 1 mg/dL (ref 0.0–1.0)
pH: 6 (ref 5.0–8.0)

## 2015-01-07 NOTE — ED Notes (Signed)
Pt. reports emesis and diarrhea onset 2 weeks ago , denies fever or chills.

## 2015-01-08 ENCOUNTER — Emergency Department (HOSPITAL_COMMUNITY)
Admission: EM | Admit: 2015-01-08 | Discharge: 2015-01-08 | Disposition: A | Discharge status: Home / Self Care | Payer: Medicaid Other | Attending: Emergency Medicine | Admitting: Emergency Medicine

## 2015-01-08 DIAGNOSIS — K2970 Gastritis, unspecified, without bleeding: Secondary | ICD-10-CM

## 2015-01-08 LAB — CBC WITH DIFFERENTIAL/PLATELET
BASOS ABS: 0.1 10*3/uL (ref 0.0–0.1)
Basophils Relative: 1 % (ref 0–1)
EOS ABS: 0.1 10*3/uL (ref 0.0–0.7)
Eosinophils Relative: 2 % (ref 0–5)
HEMATOCRIT: 39.8 % (ref 39.0–52.0)
Hemoglobin: 12.8 g/dL — ABNORMAL LOW (ref 13.0–17.0)
LYMPHS ABS: 2.4 10*3/uL (ref 0.7–4.0)
Lymphocytes Relative: 34 % (ref 12–46)
MCH: 20.9 pg — AB (ref 26.0–34.0)
MCHC: 32.2 g/dL (ref 30.0–36.0)
MCV: 65.1 fL — AB (ref 78.0–100.0)
MONOS PCT: 10 % (ref 3–12)
Monocytes Absolute: 0.7 10*3/uL (ref 0.1–1.0)
Neutro Abs: 3.9 10*3/uL (ref 1.7–7.7)
Neutrophils Relative %: 53 % (ref 43–77)
PLATELETS: 302 10*3/uL (ref 150–400)
RBC: 6.11 MIL/uL — ABNORMAL HIGH (ref 4.22–5.81)
RDW: 14.5 % (ref 11.5–15.5)
WBC: 7.2 10*3/uL (ref 4.0–10.5)

## 2015-01-08 LAB — COMPREHENSIVE METABOLIC PANEL
ALT: 51 U/L (ref 17–63)
AST: 89 U/L — ABNORMAL HIGH (ref 15–41)
Albumin: 4.4 g/dL (ref 3.5–5.0)
Alkaline Phosphatase: 64 U/L (ref 38–126)
Anion gap: 6 (ref 5–15)
BUN: 6 mg/dL (ref 6–20)
CALCIUM: 8.8 mg/dL — AB (ref 8.9–10.3)
CO2: 26 mmol/L (ref 22–32)
CREATININE: 0.8 mg/dL (ref 0.61–1.24)
Chloride: 104 mmol/L (ref 101–111)
GFR calc Af Amer: >60 mL/min (ref >60)
GFR calc non Af Amer: >60 mL/min (ref >60)
GLUCOSE: 105 mg/dL — AB (ref 65–99)
Potassium: 3.8 mmol/L (ref 3.5–5.1)
Sodium: 136 mmol/L (ref 135–145)
TOTAL PROTEIN: 7.2 g/dL (ref 6.5–8.1)
Total Bilirubin: 0.8 mg/dL (ref 0.3–1.2)

## 2015-01-08 LAB — LIPASE, BLOOD: Lipase: 18 U/L — ABNORMAL LOW (ref 22–51)

## 2015-01-08 MED ORDER — PANTOPRAZOLE SODIUM 40 MG PO TBEC
40.0000 mg | DELAYED_RELEASE_TABLET | Freq: Once | ORAL | Status: AC
  Administered 2015-01-08: 40 mg via ORAL
  Filled 2015-01-08: qty 1

## 2015-01-08 MED ORDER — LANSOPRAZOLE 30 MG PO CPDR: 30.0000 mg | DELAYED_RELEASE_CAPSULE | Freq: Every day | ORAL | Status: AC

## 2015-01-08 MED ORDER — ONDANSETRON 4 MG PO TBDP
4.0000 mg | ORAL_TABLET | Freq: Once | ORAL | Status: AC
  Administered 2015-01-08: 4 mg via ORAL
  Filled 2015-01-08: qty 1

## 2015-01-08 MED ORDER — ONDANSETRON 4 MG PO TBDP: ORAL_TABLET | ORAL | Status: AC

## 2015-01-08 MED ORDER — GI COCKTAIL ~~LOC~~
30.0000 mL | Freq: Once | ORAL | Status: AC
  Administered 2015-01-08: 30 mL via ORAL
  Filled 2015-01-08: qty 30

## 2015-01-08 NOTE — ED Provider Notes (Signed)
CSN: [id number]     Arrival date & time 01/07/15  2239 History   This chart was scribed for Paul Levy Lazaria Schaben, MD by Arlan OrganAshley Leger, ED Scribe. This patient was seen in room B19C/B19C and the patient's care was started 3:50 AM.   Chief Complaint  Patient presents with  . Emesis  . Diarrhea   HPI  HPI Comments: Paul Levy is a 19 y.o. male without any pertinent past medical history who presents to the Emergency Department complaining of intermittent, ongoing vomiting with associated nausea x 2 weeks. He is unable to keep any solid foods down but can tolerate small amounts of liquid. Pt also reports loose stools; 2 stools in last 24 hours along with epigastric abdominal tenderness described as pressure. Symptoms are made worse after eating spicy foods. No OTC medications or home remedies attempted prior to arrival. No recent fever or chills. No significant NSAID use. No previous follow up with Gastroenterologist. No previous history of abdominal surgeries. No known allergies to medications. Patient states he does not know whether he's had blood in his stool or melena. Denies blood in vomit.  Past Medical History  Diagnosis Date  . Asthma    History reviewed. No pertinent past surgical history. No family history on file. History  Substance Use Topics  . Smoking status: Current Some Day Smoker  . Smokeless tobacco: Not on file  . Alcohol Use: No    Review of Systems  Constitutional: Negative for fever and chills.  Respiratory: Negative for cough and shortness of breath.   Cardiovascular: Negative for chest pain.  Gastrointestinal: Positive for nausea, vomiting, abdominal pain and diarrhea.  Musculoskeletal: Negative for back pain, neck pain and neck stiffness.  Skin: Negative for rash.  Neurological: Negative for dizziness, weakness, light-headedness, numbness and headaches.  Psychiatric/Behavioral: Negative for confusion.  All other systems reviewed and are  negative.     Allergies  Review of patient's allergies indicates no known allergies.  Home Medications   Prior to Admission medications   Medication Sig Start Date End Date Taking? Authorizing Provider  Multiple Vitamin (MULTI-VITAMIN PO) Take 1 tablet by mouth daily.   Yes Historical Provider, MD  lansoprazole (PREVACID) 30 MG capsule Take 1 capsule (30 mg total) by mouth daily at 12 noon. 01/08/15   Paul Levy Chanin Frumkin, MD  ondansetron (ZOFRAN ODT) 4 MG disintegrating tablet 4mg  ODT q4 hours prn nausea/vomit 01/08/15   Paul Levy Danta Baumgardner, MD   Triage Vitals: BP 130/91 mmHg  Pulse 64  Temp(Src) 98.4 F (36.9 C) (Oral)  Resp 18  Ht 5\' 6"  (1.676 m)  Wt 210 lb (95.255 kg)  BMI 33.91 kg/m2  SpO2 99%   Physical Exam  Constitutional: He is oriented to person, place, and time. He appears well-developed and well-nourished. No distress.  HENT:  Head: Normocephalic and atraumatic.  Mouth/Throat: Oropharynx is clear and moist.  Eyes: EOM are normal. Pupils are equal, round, and reactive to light.  Neck: Normal range of motion. Neck supple.  Cardiovascular: Normal rate and regular rhythm.   Pulmonary/Chest: Effort normal and breath sounds normal. No respiratory distress. He has no wheezes. He has no rales.  Abdominal: Soft. Bowel sounds are normal. There is tenderness (epigastric tenderness to palpation.).  Musculoskeletal: Normal range of motion. He exhibits no edema or tenderness.  No CVA tenderness bilaterally.  Neurological: He is alert and oriented to person, place, and time.  Moves all extremities without deficit. Sensation is grossly intact.  Skin: Skin is warm and dry. No rash  noted. No erythema.  Psychiatric: He has a normal mood and affect. His behavior is normal.  Nursing note and vitals reviewed.   ED Course  Procedures (including critical care time)  DIAGNOSTIC STUDIES: Oxygen Saturation is 99% on RA, Normal by my interpretation.    COORDINATION OF CARE: 3:50 AM- Will  order CBC, CMP, Lipase, and urinalysis. Discussed treatment plan with pt at bedside and pt agreed to plan.     Labs Review Labs Reviewed  CBC WITH DIFFERENTIAL/PLATELET - Abnormal; Notable for the following:    RBC 6.11 (*)    Hemoglobin 12.8 (*)    MCV 65.1 (*)    MCH 20.9 (*)    All other components within normal limits  COMPREHENSIVE METABOLIC PANEL - Abnormal; Notable for the following:    Glucose, Bld 105 (*)    Calcium 8.8 (*)    AST 89 (*)    All other components within normal limits  LIPASE, BLOOD - Abnormal; Notable for the following:    Lipase 18 (*)    All other components within normal limits  URINALYSIS, ROUTINE W REFLEX MICROSCOPIC (NOT AT [facility]) - Abnormal; Notable for the following:    APPearance CLOUDY (*)    Hgb urine dipstick TRACE (*)    Ketones, ur 15 (*)    All other components within normal limits  URINE MICROSCOPIC-ADD ON - Abnormal; Notable for the following:    Casts HYALINE CASTS (*)    All other components within normal limits    Imaging Review No results found.   EKG Interpretation None      MDM   Final diagnoses:  Gastritis    I personally performed the services described in this documentation, which was scribed in my presence. The recorded information has been reviewed and is accurate.  Patient with constant epigastric pain worsened after eating. He's had nausea and vomiting worsened by eating solid foods. He has been able to tolerate liquids. Patient's symptoms seem more consistent with gastritis. Will treat with GI cocktail, PPI and nausea medication. Anticipate discharge home to follow-up with gastroenterology.  Paul Racer, MD 01/08/15 [phone]

## 2015-01-08 NOTE — Discharge Instructions (Signed)

## 2015-06-02 IMAGING — CT CT RENAL STONE PROTOCOL
2 of 4 series · 16 of 46 positions shown, 18 images · non-contrast
Comparison: None.

CLINICAL DATA: Chronic left flank pain for 1 month. Mild dysuria
and hematuria. Initial encounter.

EXAM:
CT ABDOMEN AND PELVIS WITHOUT CONTRAST
TECHNIQUE: Multidetector CT imaging of the abdomen and pelvis was performed
following the standard protocol without IV contrast.

[Series 2: stone study 5.0 i30f 1 · axial · 0.90mm/px · z∈[-463,+17]mm · 13 of 106 slices shown, 15 images]
[im 5/106  soft-tissue]
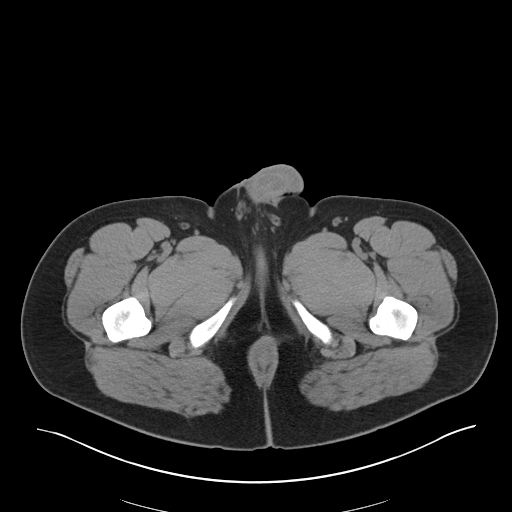
[im 5/106  bone]
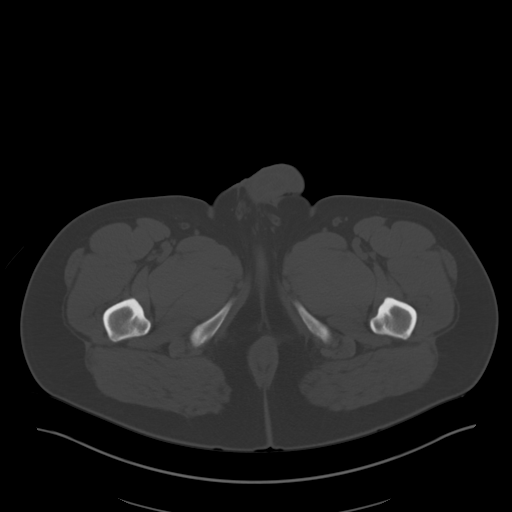
[im 14/106  soft-tissue]
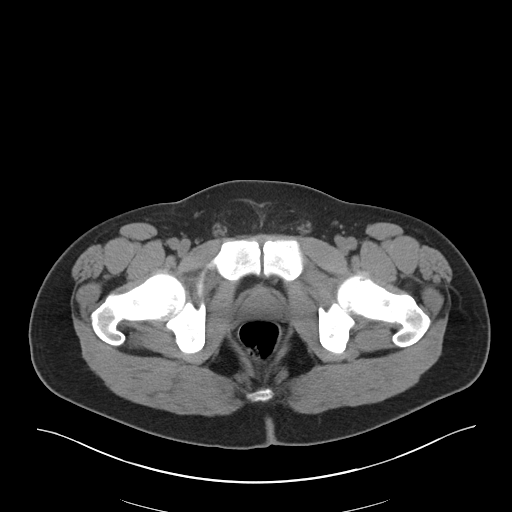
[im 23/106  soft-tissue]
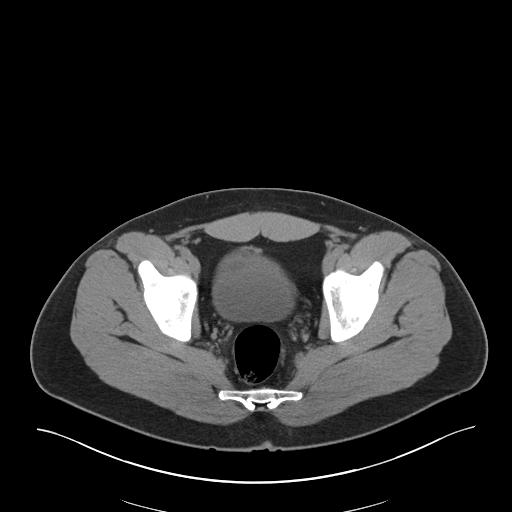
[im 28/106  soft-tissue]
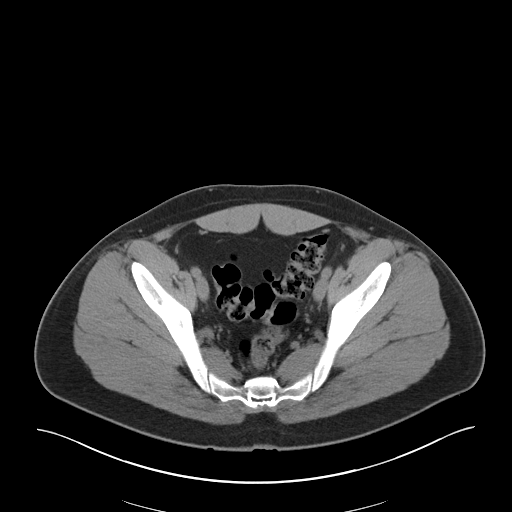
[im 37/106  soft-tissue]
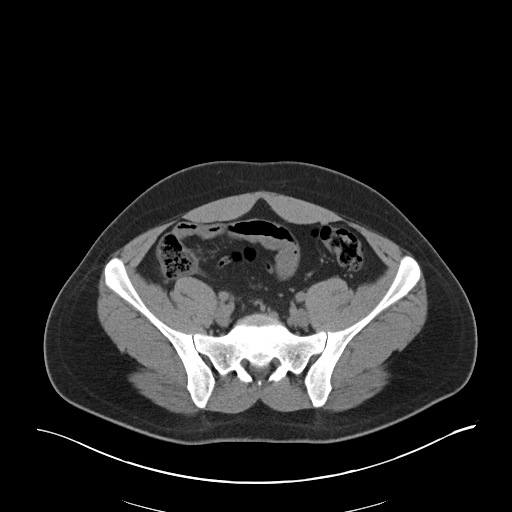
[im 46/106  soft-tissue]
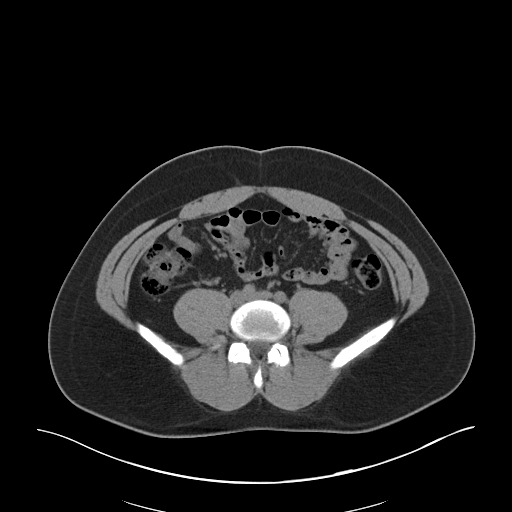
[im 55/106  soft-tissue]
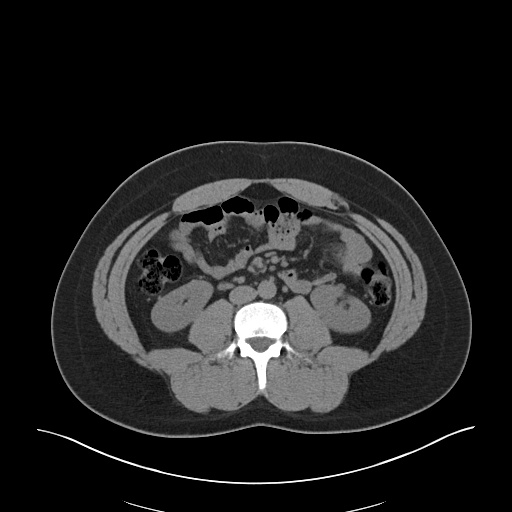
[im 60/106  soft-tissue]
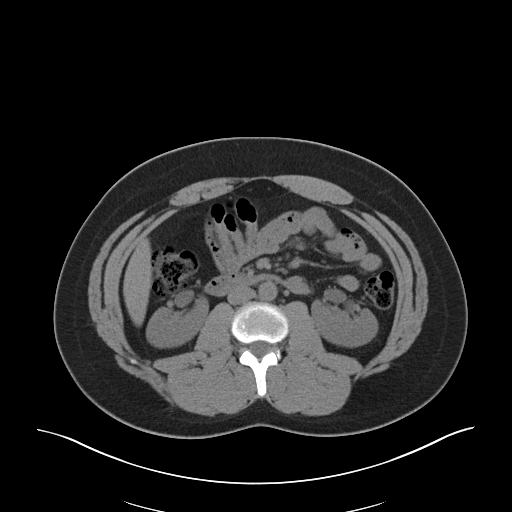
[im 69/106  soft-tissue]
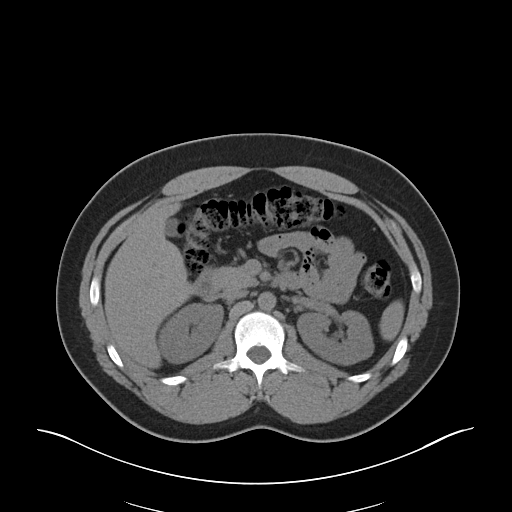
[im 69/106  bone]
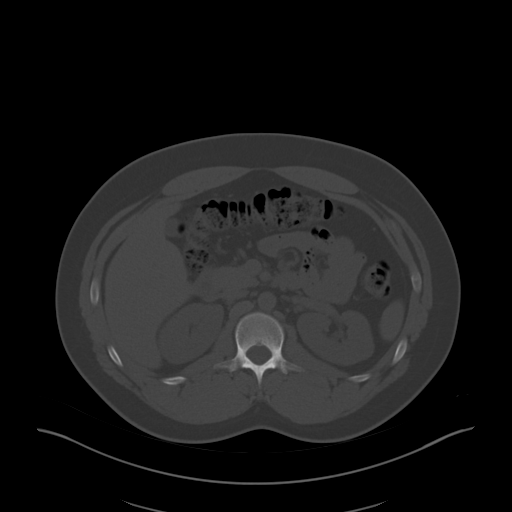
[im 78/106  soft-tissue]
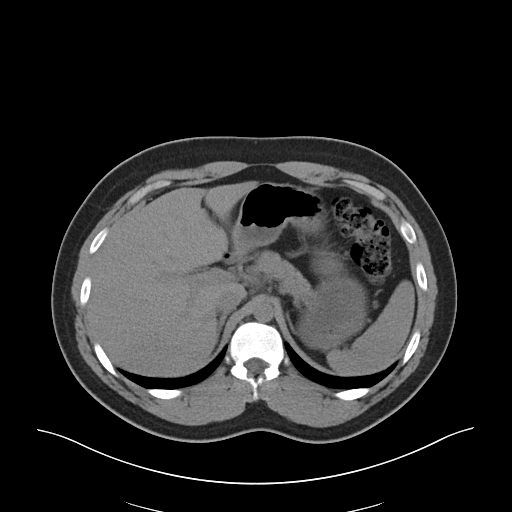
[im 83/106  soft-tissue]
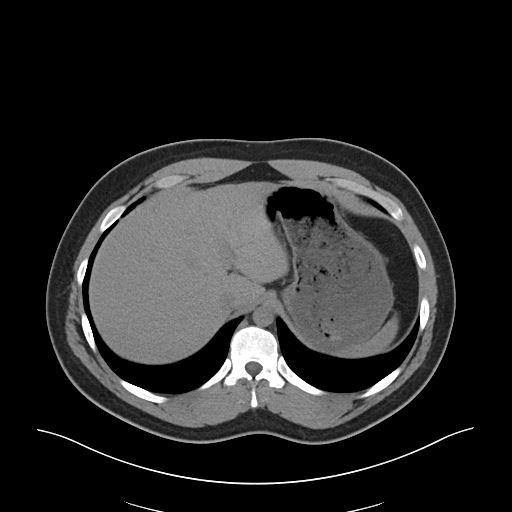
[im 92/106  soft-tissue]
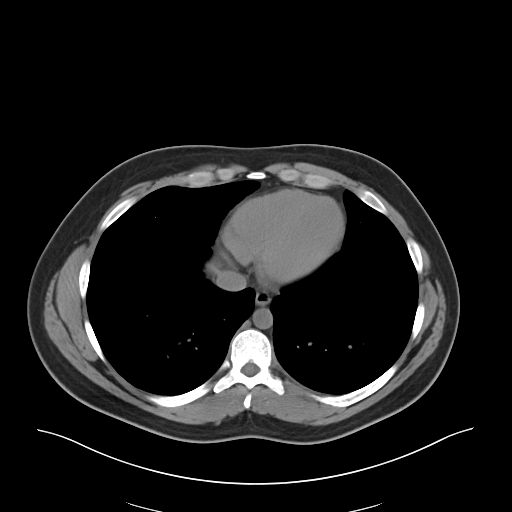
[im 101/106  soft-tissue]
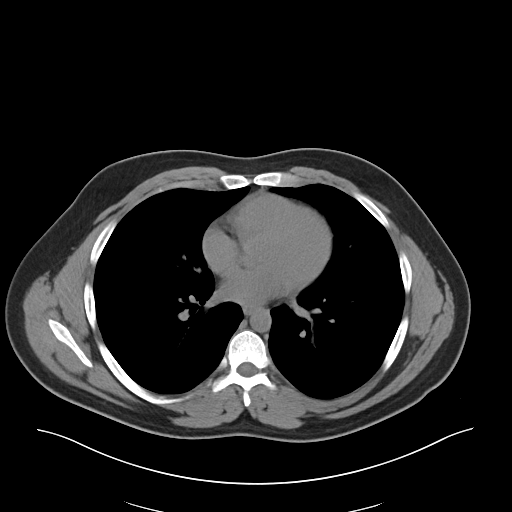

[Series 6: coronal soft tissue · coronal · 1.03mm/px · 3 of 87 slices shown]
[im 29/87  soft-tissue]
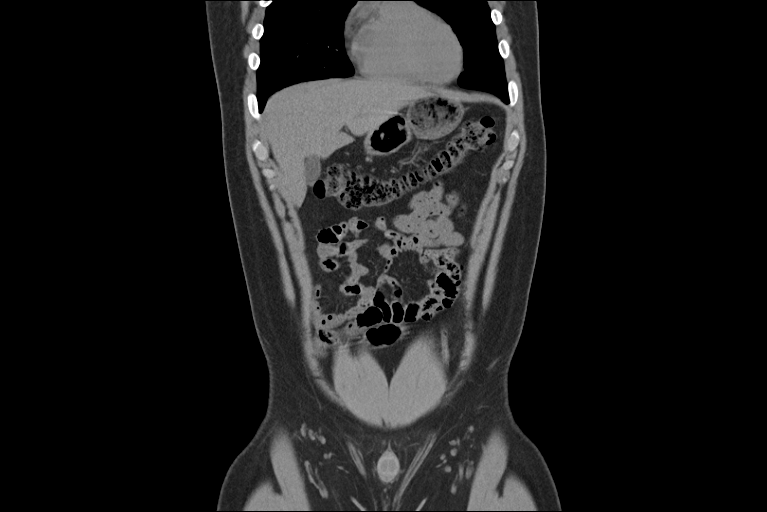
[im 39/87  soft-tissue]
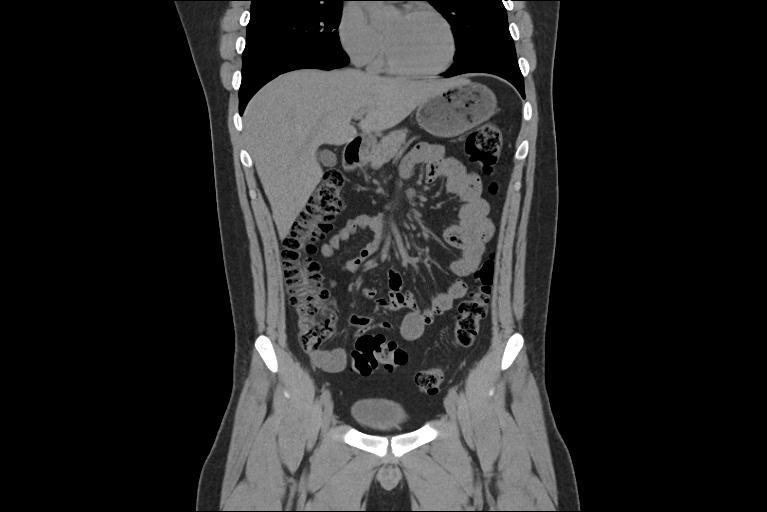
[im 48/87  soft-tissue]
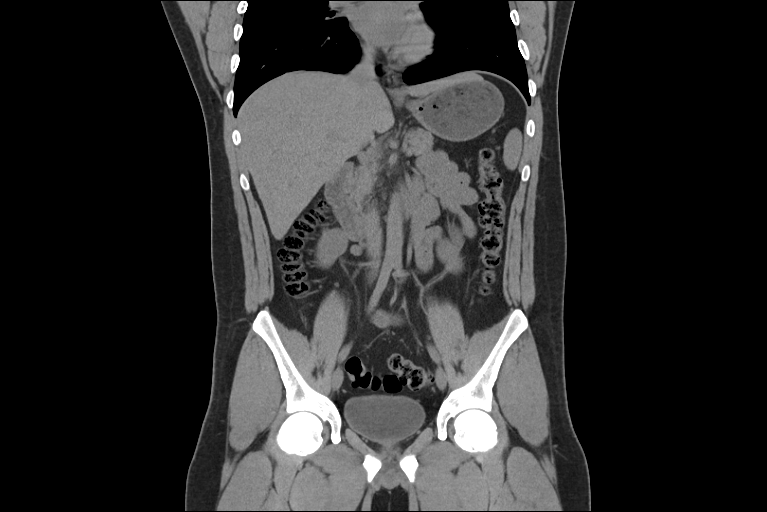

[16 of 46 positions shown; findings below may reference images not displayed]

FINDINGS: The visualized lung bases are clear.

The liver and spleen are unremarkable in appearance. The gallbladder
is within normal limits. The pancreas and adrenal glands are
unremarkable.

There is incomplete rotation of both kidneys. The kidneys are
otherwise unremarkable. There is no evidence of hydronephrosis. No
renal or ureteral stones are seen. No perinephric stranding is
appreciated.

No free fluid is identified. The small bowel is unremarkable in
appearance. The stomach is within normal limits. No acute vascular
abnormalities are seen.

The appendix is normal in caliber and contains air, without evidence
for appendicitis. The colon is unremarkable in appearance.

The bladder is mildly distended and grossly unremarkable. The
prostate remains normal in size. No inguinal lymphadenopathy is
seen.

No acute osseous abnormalities are identified.
IMPRESSION: No acute abnormality seen within the abdomen or pelvis. No evidence
of hydronephrosis. No renal or ureteral stone seen.

## 2016-03-27 ENCOUNTER — Encounter (HOSPITAL_COMMUNITY): Payer: Self-pay | Admitting: Emergency Medicine

## 2016-03-27 ENCOUNTER — Emergency Department (HOSPITAL_COMMUNITY)
Admission: EM | Admit: 2016-03-27 | Discharge: 2016-03-27 | Disposition: A | Discharge status: Home / Self Care | Payer: Medicaid Other | Attending: Emergency Medicine | Admitting: Emergency Medicine

## 2016-03-27 DIAGNOSIS — J45909 Unspecified asthma, uncomplicated: Secondary | ICD-10-CM | POA: Insufficient documentation

## 2016-03-27 DIAGNOSIS — W228XXA Striking against or struck by other objects, initial encounter: Secondary | ICD-10-CM | POA: Insufficient documentation

## 2016-03-27 DIAGNOSIS — Y999 Unspecified external cause status: Secondary | ICD-10-CM | POA: Insufficient documentation

## 2016-03-27 DIAGNOSIS — M25561 Pain in right knee: Secondary | ICD-10-CM

## 2016-03-27 DIAGNOSIS — F17200 Nicotine dependence, unspecified, uncomplicated: Secondary | ICD-10-CM | POA: Insufficient documentation

## 2016-03-27 DIAGNOSIS — Y9259 Other trade areas as the place of occurrence of the external cause: Secondary | ICD-10-CM | POA: Insufficient documentation

## 2016-03-27 NOTE — ED Provider Notes (Signed)
[facility] Provider Note   CSN: [id number] Arrival date & time: 03/27/16  1100  By signing my name below, I, Paul Levy, attest that this documentation has been prepared under the direction and in the presence of non-physician practitioner, Paul Piliyler Diesel Lina, PA-C. Electronically Signed: Modena JanskyAlbert Levy, Scribe. 03/27/2016. 11:13 AM.  History   Chief Complaint Chief Complaint  Patient presents with  . Knee Pain  . Fall   The history is provided by the patient. No language interpreter was used.   HPI Comments: Paul Levy is a 20 y.o. male who presents to the Emergency Department complaining of a fall that occurred 3 days ago. He reports he fell while moving a couch, hit/twisted his right knee on a concrete floor. Denies LOC or head injury. Reports constant moderate right knee pain described as worsening, 9/10 in severity, worsen by ambulation, and unrelieved by ibuprofen and warping affected area. Denies a popping sound, inability to ambulate, or any other complaints.    Past Medical History:  Diagnosis Date  . Asthma     There are no active problems to display for this patient.   History reviewed. No pertinent surgical history.     Home Medications    Prior to Admission medications   Medication Sig Start Date End Date Taking? Authorizing Provider  lansoprazole (PREVACID) 30 MG capsule Take 1 capsule (30 mg total) by mouth daily at 12 noon. 01/08/15   Paul Raceravid Yelverton, MD  Multiple Vitamin (MULTI-VITAMIN PO) Take 1 tablet by mouth daily.    Historical Provider, MD  ondansetron (ZOFRAN ODT) 4 MG disintegrating tablet 4mg  ODT q4 hours prn nausea/vomit 01/08/15   Paul Raceravid Yelverton, MD    Family History No family history on file.  Social History Social History  Substance Use Topics  . Smoking status: Current Some Day Smoker  . Smokeless tobacco: Never Used  . Alcohol use No     Allergies   Review of patient's allergies indicates no known allergies.   Review of  Systems Review of Systems  Constitutional: Negative for fever.  Musculoskeletal: Positive for myalgias.  Neurological: Negative for syncope.   Physical Exam Updated Vital Signs BP 132/78 (BP Location: Right Arm)   Pulse 98   Temp 98 F (36.7 C) (Oral)   Resp 14   Ht 5\' 6"  (1.676 m)   Wt 225 lb (102.1 kg)   SpO2 97%   BMI 36.32 kg/m   Physical Exam  Constitutional: He is oriented to person, place, and time. Vital signs are normal. He appears well-developed and well-nourished. No distress.  HENT:  Head: Normocephalic and atraumatic.  Right Ear: Hearing normal.  Left Ear: Hearing normal.  Eyes: Conjunctivae and EOM are normal. Pupils are equal, round, and reactive to light.  Neck: Neck supple.  Cardiovascular: Normal rate and regular rhythm.   Pulmonary/Chest: Effort normal.  Abdominal: Soft.  Musculoskeletal:  Right knee: Negative anterior/poster drawer bilaterally. Negative ballottement test. No varus or valgus laxity. No crepitus. No pain with flexion or extension. TTP along insertion of LCL on tibia. NVI. Motor/sensation intact.    Neurological: He is alert and oriented to person, place, and time.  Skin: Skin is warm and dry.  Psychiatric: He has a normal mood and affect. His speech is normal and behavior is normal. Thought content normal.  Nursing note and vitals reviewed.   ED Treatments / Results  DIAGNOSTIC STUDIES: Oxygen Saturation is 97% on RA, normal by my interpretation.    COORDINATION OF CARE: 11:17 AM- Pt  advised of Levy for treatment and pt agrees.  Labs (all labs ordered are listed, but only abnormal results are displayed) Labs Reviewed - No data to display  EKG  EKG Interpretation None       Radiology No results found.  Procedures Procedures (including critical care time)  Medications Ordered in ED Medications - No data to display   Initial Impression / Assessment and Levy / ED Course  I have reviewed the triage vital signs and the  nursing notes.  Pertinent labs & imaging results that were available during my care of the patient were reviewed by me and considered in my medical decision making (see chart for details).  Clinical Course    Final Clinical Impressions(s) / ED Diagnoses  I obtained HPI from historian.  ED Course:  Assessment: Pt with Right knee pain s/p fall several days ago. Pain managed in ED. Likely LCL sprain. No laxity of ligaments. Pt advised to follow up with PCP if symptoms persist for possibility of missed fracture diagnosis. Patient given brace while in ED, conservative therapy recommended and discussed. Patient will be dc home & is agreeable with above Levy.   Disposition/Levy:  DC Home Additional Verbal discharge instructions given and discussed with patient.  Pt Instructed to f/u with PCP in the next week for evaluation and treatment of symptoms. Return precautions given Pt acknowledges and agrees with Levy  Supervising Physician Paul Plan, MD   Final diagnoses:  Right knee pain    New Prescriptions New Prescriptions   No medications on file   I personally performed the services described in this documentation, which was scribed in my presence. The recorded information has been reviewed and is accurate.    Paul Pili, PA-C 03/27/16 1125    Paul Plan, MD 03/27/16 2020

## 2016-03-27 NOTE — Discharge Instructions (Signed)
Please read and follow all provided instructions.  Your diagnoses today include:  1. Right knee pain    Tests performed today include: Vital signs. See below for your results today.   Medications prescribed:  Take as prescribed   Home care instructions:  Follow any educational materials contained in this packet.  Follow-up instructions: Please follow-up with your primary care provider for further evaluation of symptoms and treatment   Return instructions:  Please return to the Emergency Department if you do not get better, if you get worse, or new symptoms OR  - Fever (temperature greater than 101.25F)  - Bleeding that does not stop with holding pressure to the area    -Severe pain (please note that you may be more sore the day after your accident)  - Chest Pain  - Difficulty breathing  - Severe nausea or vomiting  - Inability to tolerate food and liquids  - Passing out  - Skin becoming red around your wounds  - Change in mental status (confusion or lethargy)  - New numbness or weakness    Please return if you have any other emergent concerns.  Additional Information:  Your vital signs today were: BP 132/78 (BP Location: Right Arm)    Pulse 98    Temp 98 F (36.7 C) (Oral)    Resp 14    Ht 5\' 6"  (1.676 m)    Wt 102.1 kg    SpO2 97%    BMI 36.32 kg/m  If your blood pressure (BP) was elevated above 135/85 this visit, please have this repeated by your doctor within one month. ---------------

## 2016-03-27 NOTE — ED Notes (Signed)
Xray cancelled by PA.

## 2016-03-27 NOTE — ED Triage Notes (Signed)
Pt states he fell in a garage on concrete floor 2 days ago. C/o right lateral knee pain. Minimal swelling noted. Pt ambulates w/o difficulty.

## 2020-03-04 ENCOUNTER — Ambulatory Visit: Payer: Self-pay | Attending: Internal Medicine

## 2020-03-04 DIAGNOSIS — Z23 Encounter for immunization: Secondary | ICD-10-CM

## 2020-03-04 NOTE — Progress Notes (Signed)
   Covid-19 Vaccination [facility]  Name:  Paul Levy    MRN: [id number] DOB: 10-27-1995  03/04/2020  [name] was observed post Covid-19 immunization for 15 minutes without incident. He was provided with Vaccine Information Sheet and instruction to access the V-Safe system.   [name] was instructed to call 911 with any severe reactions post vaccine: Marland Kitchen Difficulty breathing  . Swelling of face and throat  . A fast heartbeat  . A bad rash all over body  . Dizziness and weakness   Immunizations Administered    Name Date Dose VIS Date Route   Pfizer COVID-19 Vaccine 03/04/2020 11:18 AM 0.3 mL 09/06/2018 Intramuscular   Manufacturer: [employer], [employer]   Lot: [id number]   NDC: [address]-6

## 2020-04-01 ENCOUNTER — Ambulatory Visit: Payer: Self-pay

## 2020-07-31 ENCOUNTER — Other Ambulatory Visit: Payer: Self-pay

## 2020-07-31 ENCOUNTER — Ambulatory Visit
Admission: EM | Admit: 2020-07-31 | Discharge: 2020-07-31 | Disposition: A | Discharge status: Home / Self Care | Payer: Self-pay | Attending: Emergency Medicine | Admitting: Emergency Medicine

## 2020-07-31 ENCOUNTER — Encounter: Payer: Self-pay | Admitting: Emergency Medicine

## 2020-07-31 DIAGNOSIS — Z20822 Contact with and (suspected) exposure to covid-19: Secondary | ICD-10-CM

## 2020-07-31 MED ORDER — DM-GUAIFENESIN ER 30-600 MG PO TB12: 1.0000 | ORAL_TABLET | Freq: Two times a day (BID) | ORAL | 0 refills | Status: AC

## 2020-07-31 MED ORDER — ALBUTEROL SULFATE HFA 108 (90 BASE) MCG/ACT IN AERS: 1.0000 | INHALATION_SPRAY | Freq: Four times a day (QID) | RESPIRATORY_TRACT | 0 refills | Status: AC | PRN

## 2020-07-31 MED ORDER — BENZONATATE 200 MG PO CAPS: 200.0000 mg | ORAL_CAPSULE | Freq: Three times a day (TID) | ORAL | 0 refills | Status: AC | PRN

## 2020-07-31 NOTE — Discharge Instructions (Signed)
COVID test pending, monitor MyChart for results May use albuterol inhaler as needed for shortness of breath chest tightness May use Tessalon for cough or over-the-counter Robitussin, Delsym Mucinex DM for further relief of cough and congestion Follow-up if not improving or worsening

## 2020-07-31 NOTE — ED Provider Notes (Signed)
[facility]    CSN: [id number] Arrival date & time: 07/31/20  0825      History   Chief Complaint Chief Complaint  Patient presents with  . Cough    HPI Paul Levy is a 25 y.o. male history of asthma presenting today for evaluation of URI symptoms.  Symptoms x4 days.  Reports cough fever loss of taste.  Denies known COVID exposure.  Has had some sensations of shortness of breath.  Family members with similar symptoms.  HPI  Past Medical History:  Diagnosis Date  . Asthma     There are no problems to display for this patient.   History reviewed. No pertinent surgical history.     Home Medications    Prior to Admission medications   Medication Sig Start Date End Date Taking? Authorizing Provider  albuterol (VENTOLIN HFA) 108 (90 Base) MCG/ACT inhaler Inhale 1-2 puffs into the lungs every 6 (six) hours as needed for wheezing or shortness of breath. 07/31/20  Yes Lazara Grieser C, PA-C  benzonatate (TESSALON) 200 MG capsule Take 1 capsule (200 mg total) by mouth 3 (three) times daily as needed for up to 7 days for cough. 07/31/20 08/07/20 Yes Skyelar Swigart C, PA-C  dextromethorphan-guaiFENesin (MUCINEX DM) 30-600 MG 12hr tablet Take 1 tablet by mouth 2 (two) times daily. 07/31/20  Yes Rhylee Pucillo C, PA-C  lansoprazole (PREVACID) 30 MG capsule Take 1 capsule (30 mg total) by mouth daily at 12 noon. 01/08/15   Loren Racer, MD  Multiple Vitamin (MULTI-VITAMIN PO) Take 1 tablet by mouth daily.    [provider]  ondansetron (ZOFRAN ODT) 4 MG disintegrating tablet 4mg  ODT q4 hours prn nausea/vomit 01/08/15   01/10/15, MD    Family History History reviewed. No pertinent family history.  Social History Social History   Tobacco Use  . Smoking status: Current Some Day Smoker  . Smokeless tobacco: Never Used  Substance Use Topics  . Alcohol use: No  . Drug use: Yes    Types: Marijuana    Comment: Denies today.     Allergies    Patient has no known allergies.   Review of Systems Review of Systems  Constitutional: Positive for fever. Negative for activity change, appetite change, chills and fatigue.  HENT: Positive for congestion, rhinorrhea and sore throat. Negative for ear pain, sinus pressure and trouble swallowing.   Eyes: Negative for discharge and redness.  Respiratory: Positive for cough. Negative for chest tightness and shortness of breath.   Cardiovascular: Negative for chest pain.  Gastrointestinal: Negative for abdominal pain, diarrhea, nausea and vomiting.  Musculoskeletal: Negative for myalgias.  Skin: Negative for rash.  Neurological: Negative for dizziness, light-headedness and headaches.     Physical Exam Triage Vital Signs ED Triage Vitals [07/31/20 0857]  Enc Vitals Group     BP 132/82     Pulse Rate 81     Resp 18     Temp 98.4 F (36.9 C)     Temp Source Oral     SpO2 97 %     Weight      Height      Head Circumference      Peak Flow      Pain Score 5     Pain Loc      Pain Edu?      Excl. in GC?    No data found.  Updated Vital Signs BP 132/82 (BP Location: Right Arm)   Pulse 81  Temp 98.4 F (36.9 C) (Oral)   Resp 18   SpO2 97%   Visual Acuity Right Eye Distance:   Left Eye Distance:   Bilateral Distance:    Right Eye Near:   Left Eye Near:    Bilateral Near:     Physical Exam Vitals and nursing note reviewed.  Constitutional:      Appearance: He is well-developed and well-nourished.     Comments: No acute distress  HENT:     Head: Normocephalic and atraumatic.     Ears:     Comments: Bilateral ears without tenderness to palpation of external auricle, tragus and mastoid, EAC's without erythema or swelling, TM's with good bony landmarks and cone of light. Non erythematous.     Nose: Nose normal.     Mouth/Throat:     Comments: Oral mucosa pink and moist, no tonsillar enlargement or exudate. Posterior pharynx patent and nonerythematous, no uvula  deviation or swelling. Normal phonation. Eyes:     Conjunctiva/sclera: Conjunctivae normal.  Cardiovascular:     Rate and Rhythm: Normal rate and regular rhythm.  Pulmonary:     Effort: Pulmonary effort is normal. No respiratory distress.     Comments: Breathing comfortably at rest, CTABL, no wheezing, rales or other adventitious sounds auscultated Abdominal:     General: There is no distension.  Musculoskeletal:        General: Normal range of motion.     Cervical back: Neck supple.  Skin:    General: Skin is warm and dry.  Neurological:     Mental Status: He is alert and oriented to person, place, and time.  Psychiatric:        Mood and Affect: Mood and affect normal.      UC Treatments / Results  Labs (all labs ordered are listed, but only abnormal results are displayed) Labs Reviewed  NOVEL CORONAVIRUS, NAA    EKG   Radiology No results found.  Procedures Procedures (including critical care time)  Medications Ordered in UC Medications - No data to display  Initial Impression / Assessment and Plan / UC Course  I have reviewed the triage vital signs and the nursing notes.  Pertinent labs & imaging results that were available during my care of the patient were reviewed by me and considered in my medical decision making (see chart for details).     Viral URI with cough-COVID test pending, suspect viral etiology, recommending symptomatic and supportive care.  Lungs clear.  Discussed strict return precautions. Patient verbalized understanding and is agreeable with plan.  Final Clinical Impressions(s) / UC Diagnoses   Final diagnoses:  Encounter for screening laboratory testing for COVID-19 virus     Discharge Instructions     COVID test pending, monitor MyChart for results May use albuterol inhaler as needed for shortness of breath chest tightness May use Tessalon for cough or over-the-counter Robitussin, Delsym Mucinex DM for further relief of cough and  congestion Follow-up if not improving or worsening    ED Prescriptions    Medication Sig Dispense Auth. Provider   benzonatate (TESSALON) 200 MG capsule Take 1 capsule (200 mg total) by mouth 3 (three) times daily as needed for up to 7 days for cough. 28 capsule Julliana Whitmyer C, PA-C   dextromethorphan-guaiFENesin (MUCINEX DM) 30-600 MG 12hr tablet Take 1 tablet by mouth 2 (two) times daily. 20 tablet Meggie Laseter C, PA-C   albuterol (VENTOLIN HFA) 108 (90 Base) MCG/ACT inhaler Inhale 1-2 puffs into the lungs  every 6 (six) hours as needed for wheezing or shortness of breath. 8 g Chesney Klimaszewski, [place] C, PA-C     PDMP not reviewed this encounter.   Lew Dawes, [place] 07/31/20 [phone]

## 2020-07-31 NOTE — ED Triage Notes (Signed)
Pt here for cough, fever, loss of taste and possible covid exposure x 4 days

## 2020-08-02 LAB — NOVEL CORONAVIRUS, NAA

## 2020-08-29 ENCOUNTER — Ambulatory Visit: Payer: Self-pay | Admitting: Family Medicine

## 2020-11-06 NOTE — Progress Notes (Deleted)
    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   PERTINENT  PMH / PSH: asthma, ***  OBJECTIVE:   There were no vitals taken for this visit.  General: ***, NAD CV: RRR, no murmurs*** Pulm: CTAB, no wheezes or rales  ASSESSMENT/PLAN:   No problem-specific Assessment & Plan notes found for this encounter.    HCM - HCV, HIV *** - Tdap *** - HPV *** - Covid vaccine ***  Littie Deeds, MD [facility]   {    This will disappear when note is signed, click to select method of visit    :1}

## 2020-11-07 ENCOUNTER — Encounter: Payer: Self-pay | Admitting: Family Medicine

## 2020-11-07 ENCOUNTER — Ambulatory Visit: Payer: Self-pay | Admitting: Family Medicine
